# Patient Record
Sex: Male | Born: 1980 | Race: White | Hispanic: No
Health system: Southern US, Community
[De-identification: ages and names within clinical notes are randomized; demographics above are authoritative.]

## PROBLEM LIST (undated history)

## (undated) DIAGNOSIS — G35D Multiple sclerosis, unspecified: Secondary | ICD-10-CM

---

## 2020-08-12 ENCOUNTER — Emergency Department (HOSPITAL_COMMUNITY)
Admission: EM | Admit: 2020-08-12 | Discharge: 2020-08-13 | Disposition: A | Discharge status: Home / Self Care | Payer: BC Managed Care – PPO | Attending: Emergency Medicine | Admitting: Emergency Medicine

## 2020-08-12 ENCOUNTER — Emergency Department (HOSPITAL_COMMUNITY): Payer: BC Managed Care – PPO

## 2020-08-12 DIAGNOSIS — R000 Tachycardia, unspecified: Secondary | ICD-10-CM | POA: Insufficient documentation

## 2020-08-12 DIAGNOSIS — U071 COVID-19: Secondary | ICD-10-CM

## 2020-08-12 DIAGNOSIS — R0682 Tachypnea, not elsewhere classified: Secondary | ICD-10-CM | POA: Diagnosis not present

## 2020-08-12 DIAGNOSIS — R059 Cough, unspecified: Secondary | ICD-10-CM | POA: Diagnosis present

## 2020-08-12 DIAGNOSIS — R0602 Shortness of breath: Secondary | ICD-10-CM

## 2020-08-12 LAB — CBC WITH DIFFERENTIAL/PLATELET
Abs Immature Granulocytes: 0.03 10*3/uL (ref 0.00–0.07)
Basophils Absolute: 0 10*3/uL (ref 0.0–0.1)
Basophils Relative: 1 %
Eosinophils Absolute: 0.1 10*3/uL (ref 0.0–0.5)
Eosinophils Relative: 3 %
HCT: 47.3 % (ref 39.0–52.0)
Hemoglobin: 15.5 g/dL (ref 13.0–17.0)
Immature Granulocytes: 1 %
Lymphocytes Relative: 15 %
Lymphs Abs: 0.6 10*3/uL — ABNORMAL LOW (ref 0.7–4.0)
MCH: 28.8 pg (ref 26.0–34.0)
MCHC: 32.8 g/dL (ref 30.0–36.0)
MCV: 87.9 fL (ref 80.0–100.0)
Monocytes Absolute: 0.6 10*3/uL (ref 0.1–1.0)
Monocytes Relative: 13 %
Neutro Abs: 3 10*3/uL (ref 1.7–7.7)
Neutrophils Relative %: 67 %
Platelets: 187 10*3/uL (ref 150–400)
RBC: 5.38 MIL/uL (ref 4.22–5.81)
RDW: 14.4 % (ref 11.5–15.5)
WBC: 4.3 10*3/uL (ref 4.0–10.5)
nRBC: 0 % (ref 0.0–0.2)

## 2020-08-12 LAB — COMPREHENSIVE METABOLIC PANEL
ALT: 41 U/L (ref 0–44)
AST: 23 U/L (ref 15–41)
Albumin: 4.1 g/dL (ref 3.5–5.0)
Alkaline Phosphatase: 60 U/L (ref 38–126)
Anion gap: 11 (ref 5–15)
BUN: 13 mg/dL (ref 6–20)
CO2: 21 mmol/L — ABNORMAL LOW (ref 22–32)
Calcium: 9.2 mg/dL (ref 8.9–10.3)
Chloride: 108 mmol/L (ref 98–111)
Creatinine, Ser: 1.05 mg/dL (ref 0.61–1.24)
GFR, Estimated: >60 mL/min (ref >60)
Glucose, Bld: 111 mg/dL — ABNORMAL HIGH (ref 70–99)
Potassium: 3.6 mmol/L (ref 3.5–5.1)
Sodium: 140 mmol/L (ref 135–145)
Total Bilirubin: 0.6 mg/dL (ref 0.3–1.2)
Total Protein: 7.1 g/dL (ref 6.5–8.1)

## 2020-08-12 LAB — LACTIC ACID, PLASMA: Lactic Acid, Venous: 1.2 mmol/L (ref 0.5–1.9)

## 2020-08-12 LAB — BRAIN NATRIURETIC PEPTIDE: B Natriuretic Peptide: 8.9 pg/mL (ref 0.0–100.0)

## 2020-08-12 LAB — D-DIMER, QUANTITATIVE: D-Dimer, Quant: 1.35 ug/mL-FEU — ABNORMAL HIGH (ref 0.00–0.50)

## 2020-08-12 LAB — TROPONIN I (HIGH SENSITIVITY): Troponin I (High Sensitivity): 2 ng/L (ref <18)

## 2020-08-12 LAB — PROTIME-INR
INR: 1 (ref 0.8–1.2)
Prothrombin Time: 12.8 seconds (ref 11.4–15.2)

## 2020-08-12 MED ORDER — ALBUTEROL SULFATE HFA 108 (90 BASE) MCG/ACT IN AERS
2.0000 | INHALATION_SPRAY | RESPIRATORY_TRACT | Status: DC | PRN
  Administered 2020-08-12: 23:00:00 2 via RESPIRATORY_TRACT
  Filled 2020-08-12: qty 6.7

## 2020-08-12 NOTE — ED Provider Notes (Signed)
[facility] EMERGENCY DEPARTMENT Provider Note   CSN: [id number] Arrival date & time: 08/12/20  2126     History Chief Complaint  Patient presents with  . Shortness of Breath  . Chest Pain    Dakota Andersen is a 39 y.o. male.  HPI Patient has had 2 Pfizer  immunizations for Covid administered around August 2021.  He reports that 6 days ago he started developing symptoms of body ache and cough.  He got tested for Covid the following day.  His test result came back positive yesterday.  Patient reports over the past several days now he has had increasing cough.  He has become increasingly short of breath and today is finding breathing very difficult.  He has not had any vomiting.  There has been moderate amount of diarrhea.  No abdominal pain.  He has not documented a fever.  Has had increased fatigue and body aches.  Patient has history of MS and is treated with          .  Patient is a non-smoker.  No history of asthma.  No known sick contacts.    No past medical history on file.  There are no problems to display for this patient.   Past medical history: Multiple sclerosis     No family history on file.     Home Medications Prior to Admission medications   Not on File    Allergies    Patient has no allergy information on record.  Review of Systems   Review of Systems 10 systems reviewed and negative except as per HPI Physical Exam Updated Vital Signs BP (!) 122/93   Pulse 98   Temp 98.1 F (36.7 C) (Oral)   Resp 17   SpO2 98%   Physical Exam Constitutional:      Comments: Patient is alert.  He is uncomfortable appearance.  Tachypneic.  Mental status clear.  HENT:     Head: Normocephalic and atraumatic.     Mouth/Throat:     Pharynx: Oropharynx is clear.  Eyes:     Extraocular Movements: Extraocular movements intact.  Cardiovascular:     Comments: Borderline tachycardia no gross rub murmur gallop. Pulmonary:     Comments: Tachypnea.   Breath sounds decreased at bases. Abdominal:     General: There is no distension.     Palpations: Abdomen is soft.     Tenderness: There is no abdominal tenderness. There is no guarding.  Musculoskeletal:        General: No swelling or tenderness. Normal range of motion.     Right lower leg: No edema.     Left lower leg: No edema.  Skin:    General: Skin is warm and dry.  Neurological:     General: No focal deficit present.     Mental Status: He is oriented to person, place, and time.     Coordination: Coordination normal.     ED Results / Procedures / Treatments   Labs (all labs ordered are listed, but only abnormal results are displayed) Labs Reviewed  COMPREHENSIVE METABOLIC PANEL  BRAIN NATRIURETIC PEPTIDE  LACTIC ACID, PLASMA  LACTIC ACID, PLASMA  CBC WITH DIFFERENTIAL/PLATELET  D-DIMER, QUANTITATIVE (NOT AT [facility])  PROTIME-INR  TROPONIN I (HIGH SENSITIVITY)    EKG EKG Interpretation  Date/Time:  Monday August 12 2020 21:27:06 EST Ventricular Rate:  112 PR Interval:    QRS Duration: 85 QT Interval:  320 QTC Calculation: 437 R Axis:   -  18 Text Interpretation: Sinus tachycardia Borderline left axis deviation agree, no acute ischemic appearance, no old comparison Confirmed by Arby Barrette [id number]) on 08/12/2020 10:12:11 PM   Radiology DG Chest Port 1 View  Result Date: 08/12/2020 CLINICAL DATA:  COVID-19 positive, short of breath, chest pain EXAM: PORTABLE CHEST 1 VIEW COMPARISON:  07/22/2017 FINDINGS: The heart size and mediastinal contours are within normal limits. Both lungs are clear. The visualized skeletal structures are unremarkable. IMPRESSION: No active disease. Electronically Signed   By: Sharlet Salina M.D.   On: 08/12/2020 22:00    Procedures Procedures (including critical care time)  Medications Ordered in ED Medications  albuterol (VENTOLIN HFA) 108 (90 Base) MCG/ACT inhaler 2 puff (has no administration in time range)    ED Course  I  have reviewed the triage vital signs and the nursing notes.  Pertinent labs & imaging results that were available during my care of the patient were reviewed by me and considered in my medical decision making (see chart for details).    MDM Rules/Calculators/A&P                           Dakota Andersen was evaluated in Emergency Department on 08/12/2020 for the symptoms described in the history of present illness. He was evaluated in the context of the global COVID-19 pandemic, which necessitated consideration that the patient might be at risk for infection with the SARS-CoV-2 virus that causes COVID-19. Institutional protocols and algorithms that pertain to the evaluation of patients at risk for COVID-19 are in a state of rapid change based on information released by regulatory bodies including the CDC and federal and state organizations. These policies and algorithms were followed during the patient's care in the ED.  Patient presents increasingly symptomatic with shortness of breath with diagnosis of Covid.  Will initiate diagnostic evaluation.  Patient has risk factor of MS and obesity for more complicated course.  Patient has had 2 immunizations.  Will obtain D-dimer.  Patient had significant increase in shortness of breath.  May need CTA.  Final Clinical Impression(s) / ED Diagnoses Final diagnoses:  COVID-19    Rx / DC Orders ED Discharge Orders    None       Arby Barrette, MD 08/15/20 1710

## 2020-08-12 NOTE — ED Triage Notes (Signed)
BIB by EMS for shortness of breath and chest discomfort. Pain worse with movement. Chest discomfort started today while at rest. SpO2 on RA with EMS 95%, placed on 2L for comfort. SPO2 currently 98% on room air, respirations even and unlabored. RR of 19. Tested for COVID 12/22, found out positive 12/25. Patient was vaccinated for COVID, Pfizer - both doses, no booster.

## 2020-08-13 ENCOUNTER — Emergency Department (HOSPITAL_COMMUNITY): Payer: BC Managed Care – PPO

## 2020-08-13 LAB — LACTIC ACID, PLASMA: Lactic Acid, Venous: 0.9 mmol/L (ref 0.5–1.9)

## 2020-08-13 LAB — TROPONIN I (HIGH SENSITIVITY): Troponin I (High Sensitivity): 2 ng/L (ref <18)

## 2020-08-13 MED ORDER — IOHEXOL 350 MG/ML SOLN
80.0000 mL | Freq: Once | INTRAVENOUS | Status: AC | PRN
  Administered 2020-08-13: 80 mL via INTRAVENOUS

## 2020-08-13 NOTE — ED Notes (Signed)
Patient back from CT.

## 2020-08-13 NOTE — ED Provider Notes (Signed)
Care assumed from Dr. Donnald Garre, patient with COVID-19 diagnosed, here for shortness of breath.  Chest x-ray was unremarkable.  He was signed out to me pending D-dimer results.  D-dimer has come back elevated whereas other labs were normal including troponin and BNP.  He was sent for CT angiogram of the chest which showed no evidence of pulmonary embolism, and also showed no evidence of groundglass densities or pneumonia.  Patient was advised of these findings.  He was ambulated in the room and to maintain adequate oxygen saturation during ambulation.  He was therefore felt to be safe for discharge.  He does have some risk factors for severe disease, and his information is sent to the monoclonal antibody clinic for consideration for monoclonal antibody infusion as an outpatient.  He is given strict return precautions.  Results for orders placed or performed during the hospital encounter of 08/12/20  Comprehensive metabolic panel  Result Value Ref Range   Sodium 140 135 - 145 mmol/L   Potassium 3.6 3.5 - 5.1 mmol/L   Chloride 108 98 - 111 mmol/L   CO2 21 (L) 22 - 32 mmol/L   Glucose, Bld 111 (H) 70 - 99 mg/dL   BUN 13 6 - 20 mg/dL   Creatinine, Ser 0.62 0.61 - 1.24 mg/dL   Calcium 9.2 8.9 - 69.4 mg/dL   Total Protein 7.1 6.5 - 8.1 g/dL   Albumin 4.1 3.5 - 5.0 g/dL   AST 23 15 - 41 U/L   ALT 41 0 - 44 U/L   Alkaline Phosphatase 60 38 - 126 U/L   Total Bilirubin 0.6 0.3 - 1.2 mg/dL   GFR, Estimated >85 >46 mL/min   Anion gap 11 5 - 15  Brain natriuretic peptide  Result Value Ref Range   B Natriuretic Peptide 8.9 0.0 - 100.0 pg/mL  Lactic acid, plasma  Result Value Ref Range   Lactic Acid, Venous 1.2 0.5 - 1.9 mmol/L  Lactic acid, plasma  Result Value Ref Range   Lactic Acid, Venous 0.9 0.5 - 1.9 mmol/L  CBC with Differential  Result Value Ref Range   WBC 4.3 4.0 - 10.5 K/uL   RBC 5.38 4.22 - 5.81 MIL/uL   Hemoglobin 15.5 13.0 - 17.0 g/dL   HCT 27.0 35.0 - 09.3 %   MCV 87.9 80.0 -  100.0 fL   MCH 28.8 26.0 - 34.0 pg   MCHC 32.8 30.0 - 36.0 g/dL   RDW 81.8 29.9 - 37.1 %   Platelets 187 150 - 400 K/uL   nRBC 0.0 0.0 - 0.2 %   Neutrophils Relative % 67 %   Neutro Abs 3.0 1.7 - 7.7 K/uL   Lymphocytes Relative 15 %   Lymphs Abs 0.6 (L) 0.7 - 4.0 K/uL   Monocytes Relative 13 %   Monocytes Absolute 0.6 0.1 - 1.0 K/uL   Eosinophils Relative 3 %   Eosinophils Absolute 0.1 0.0 - 0.5 K/uL   Basophils Relative 1 %   Basophils Absolute 0.0 0.0 - 0.1 K/uL   Immature Granulocytes 1 %   Abs Immature Granulocytes 0.03 0.00 - 0.07 K/uL  D-dimer, quantitative  Result Value Ref Range   D-Dimer, Quant 1.35 (H) 0.00 - 0.50 ug/mL-FEU  Protime-INR  Result Value Ref Range   Prothrombin Time 12.8 11.4 - 15.2 seconds   INR 1.0 0.8 - 1.2  Troponin I (High Sensitivity)  Result Value Ref Range   Troponin I (High Sensitivity) 2 <18 ng/L  Troponin I (High Sensitivity)  Result Value Ref Range   Troponin I (High Sensitivity) 2 <18 ng/L   CT Angio Chest PE W and/or Wo Contrast  Result Date: 08/13/2020 CLINICAL DATA:  Dyspnea, chest pain, elevated D-dimer EXAM: CT ANGIOGRAPHY CHEST WITH CONTRAST TECHNIQUE: Multidetector CT imaging of the chest was performed using the standard protocol during bolus administration of intravenous contrast. Multiplanar CT image reconstructions and MIPs were obtained to evaluate the vascular anatomy. CONTRAST:  70mL OMNIPAQUE IOHEXOL 350 MG/ML SOLN COMPARISON:  None. FINDINGS: Cardiovascular: The pulmonary arterial tree is well opacified. There is no intraluminal filling defect identified to suggest acute pulmonary embolism. The central pulmonary arteries are of normal caliber. No significant coronary artery calcification. Global cardiac size within normal limits. No pericardial effusion. The thoracic aorta is unremarkable. Mediastinum/Nodes: No enlarged mediastinal, hilar, or axillary lymph nodes. Thyroid gland, trachea, and esophagus demonstrate no significant  findings. Lungs/Pleura: Lungs are clear. No pleural effusion or pneumothorax. Upper Abdomen: No acute abnormality. Musculoskeletal: The osseous structures are age-appropriate. No acute bone abnormality identified within the thorax. Review of the MIP images confirms the above findings. IMPRESSION: No pulmonary embolism. No radiographic evidence of acute cardiopulmonary disease. Electronically Signed   By: Helyn Numbers MD   On: 08/13/2020 02:17   DG Chest Port 1 View  Result Date: 08/12/2020 CLINICAL DATA:  COVID-19 positive, short of breath, chest pain EXAM: PORTABLE CHEST 1 VIEW COMPARISON:  07/22/2017 FINDINGS: The heart size and mediastinal contours are within normal limits. Both lungs are clear. The visualized skeletal structures are unremarkable. IMPRESSION: No active disease. Electronically Signed   By: Sharlet Salina M.D.   On: 08/12/2020 22:00   Images viewed by me.    Dione Booze, MD 08/13/20 [phone]

## 2020-08-13 NOTE — ED Notes (Signed)
Pulse ox checked while ambulating in room on room air. Patient tolerated well with no increased respiratory effort. SpO2 remained 97-98%. Dr. Preston Fleeting made aware of same.

## 2020-08-13 NOTE — ED Notes (Signed)
Patient to CT.

## 2020-08-13 NOTE — Discharge Instructions (Addendum)
In spite of the way you feel, your oxygen levels were good - even when you were walking.  I have sent your information to the clinic that administers monoclonal antibodies to treat COVID-19. They will contact you if you qualify for the infusion.  Return if your symptoms are getting worse.

## 2022-02-17 IMAGING — DX DG CHEST 1V PORT
1 series · 1 of 1 positions shown · non-contrast
Comparison: 07/22/2017

CLINICAL DATA: [id number] positive, short of breath, chest pain

EXAM:
PORTABLE CHEST 1 VIEW

[chest ap]
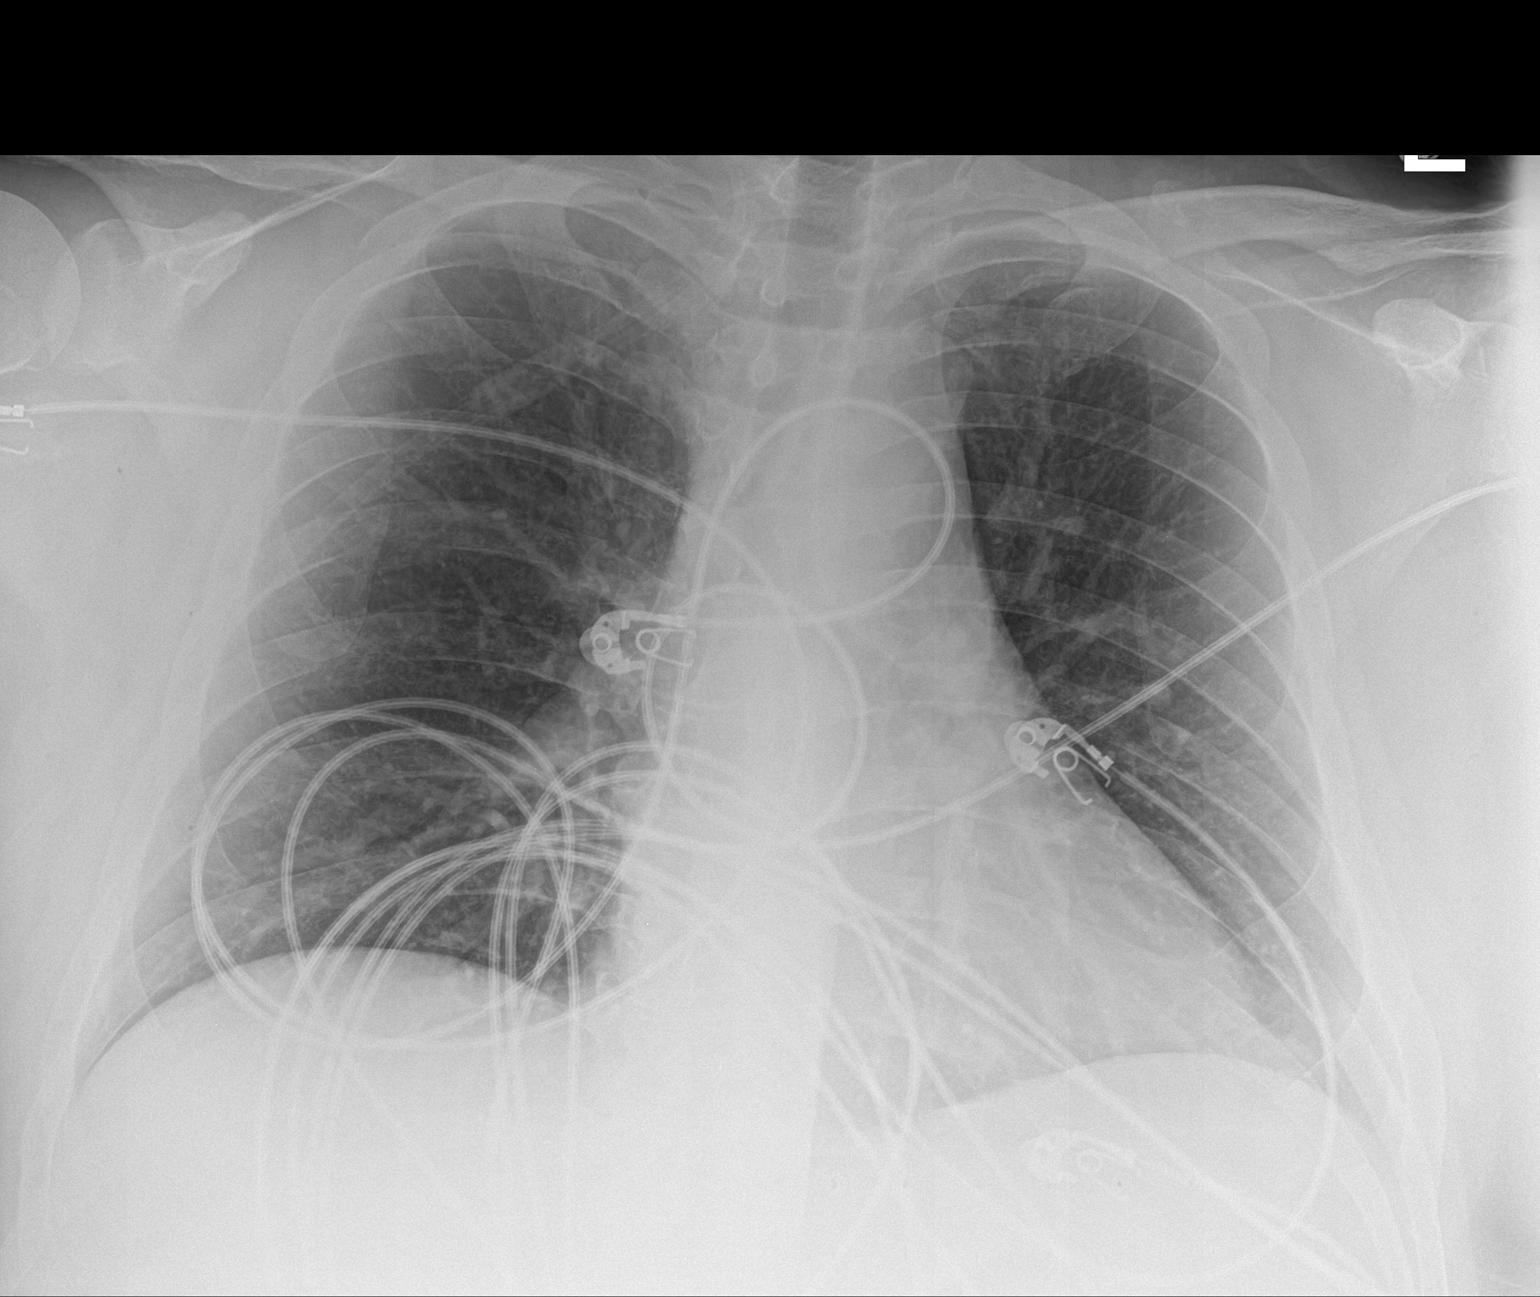

[1 of 1 positions shown; findings below may reference images not displayed]

FINDINGS: The heart size and mediastinal contours are within normal limits.
Both lungs are clear. The visualized skeletal structures are
unremarkable.
IMPRESSION: No active disease.

## 2024-06-30 ENCOUNTER — Encounter (HOSPITAL_BASED_OUTPATIENT_CLINIC_OR_DEPARTMENT_OTHER): Payer: Self-pay

## 2024-06-30 ENCOUNTER — Ambulatory Visit (HOSPITAL_BASED_OUTPATIENT_CLINIC_OR_DEPARTMENT_OTHER)
Admission: RE | Admit: 2024-06-30 | Discharge: 2024-06-30 | Disposition: A | Discharge status: Home / Self Care | Source: Ambulatory Visit | Attending: Family Medicine | Admitting: Family Medicine

## 2024-06-30 ENCOUNTER — Ambulatory Visit (HOSPITAL_BASED_OUTPATIENT_CLINIC_OR_DEPARTMENT_OTHER): Admit: 2024-06-30 | Discharge: 2024-06-30 | Disposition: A | Discharge status: Home / Self Care | Admitting: Radiology

## 2024-06-30 VITALS — BP 105/74 | HR 80 | Temp 98.3°F | Resp 20

## 2024-06-30 DIAGNOSIS — R1032 Left lower quadrant pain: Secondary | ICD-10-CM

## 2024-06-30 DIAGNOSIS — M549 Dorsalgia, unspecified: Secondary | ICD-10-CM | POA: Diagnosis not present

## 2024-06-30 DIAGNOSIS — R10A2 Flank pain, left side: Secondary | ICD-10-CM

## 2024-06-30 DIAGNOSIS — R1012 Left upper quadrant pain: Secondary | ICD-10-CM

## 2024-06-30 DIAGNOSIS — Z87442 Personal history of urinary calculi: Secondary | ICD-10-CM

## 2024-06-30 HISTORY — DX: Multiple sclerosis, unspecified: G35.D

## 2024-06-30 LAB — POCT URINE DIPSTICK
Bilirubin, UA: NEGATIVE
Blood, UA: NEGATIVE
Glucose, UA: NEGATIVE mg/dL
Leukocytes, UA: NEGATIVE
Nitrite, UA: NEGATIVE
Protein Ur, POC: 30 mg/dL — AB
Spec Grav, UA: >=1.03 — AB (ref 1.010–1.025)
Urobilinogen, UA: 0.2 U/dL
pH, UA: 5.5 (ref 5.0–8.0)

## 2024-06-30 MED ORDER — KETOROLAC TROMETHAMINE 30 MG/ML IJ SOLN
30.0000 mg | Freq: Once | INTRAMUSCULAR | Status: AC
  Administered 2024-06-30: 30 mg via INTRAMUSCULAR

## 2024-06-30 MED ORDER — KETOROLAC TROMETHAMINE 10 MG PO TABS: 10.0000 mg | ORAL_TABLET | Freq: Four times a day (QID) | ORAL | 0 refills | Status: AC | PRN

## 2024-06-30 NOTE — Discharge Instructions (Addendum)
 Mid back pain on the left, left flank pain, history of kidney stones, left upper and lower abdominal pain: Urinalysis shows concentrated urine but no blood and no sign of infection.  Abdominal x-ray was negative for kidney stones.    Ketorolac 30 mg injection for pain now.  Ketorolac 10 mg, with food, every 6 hours if needed for back or flank pain.  Advised to continue to try to get 3 to 4 L of fluids daily.  If pain persist he needs to go to an emergency room where he can get a CT scan to rule out stones that are not big enough to be visible on abdominal x-ray (KUB).  Follow-up with primary care if symptoms persist, do not resolve, or worsen.  May need referral to urology.

## 2024-06-30 NOTE — ED Triage Notes (Signed)
 Pt c/o back pain that started yesterday, he thinks it might be a kidney stone, back is lower left side.

## 2024-06-30 NOTE — ED Provider Notes (Addendum)
 [facility]    CSN: [id number] Arrival date & time: 06/30/24  1742      History   Chief Complaint Chief Complaint  Patient presents with   Back Pain    Entered by patient    HPI Dakota Andersen is a 43 y.o. male.   43 year old male with complaint of acute onset left flank or back pain that started on 06/29/2024.  He has had kidney stones in the past and believes he is trying to pass a kidney stone.  The pain got worse today and he has become nauseous but has not vomited.  He has left upper quadrant and left lower quadrant abdominal pain.  He denies fever, frequency of urination, urgency of urination nor hematuria.   Back Pain Associated symptoms: abdominal pain   Associated symptoms: no chest pain, no dysuria and no fever     Past Medical History:  Diagnosis Date   MS (multiple sclerosis)     There are no active problems to display for this patient.   History reviewed. No pertinent surgical history.     Home Medications    Prior to Admission medications   Medication Sig Start Date End Date Taking? Authorizing Provider  ascorbic acid (VITAMIN C) 500 MG tablet Take 500 mg by mouth daily.   Yes [provider]  Cholecalciferol (VITAMIN D3 PO) Take 1 capsule by mouth daily.   Yes [provider]  Diroximel Fumarate (VUMERITY) 231 MG CPDR Take 462 mg by mouth in the morning and at bedtime.   Yes [provider]  ketorolac (TORADOL) 10 MG tablet Take 1 tablet (10 mg total) by mouth every 6 (six) hours as needed. 06/30/24  Yes Ival Domino, FNP  topiramate (TOPAMAX) 50 MG tablet Take 50 mg by mouth in the morning and at bedtime.   Yes [provider]  albuterol  (PROVENTIL ) (2.5 MG/3ML) 0.083% nebulizer solution Take 2.5 mg by nebulization every 6 (six) hours as needed for wheezing or shortness of breath.    [provider]  COD LIVER OIL PO Take 1 capsule by mouth daily.    [provider]  Cyanocobalamin  (VITAMIN B12 PO) Take 1 tablet by mouth daily.    [provider]  Omega-3 Fatty Acids (OMEGA 3 PO) Take 1 capsule by mouth daily.    [provider]    Family History History reviewed. No pertinent family history.  Social History Social History   Tobacco Use   Smoking status: Never   Smokeless tobacco: Never  Vaping Use   Vaping status: Never Used  Substance Use Topics   Alcohol use: Never   Drug use: Never     Allergies   Gadobenate and Latex   Review of Systems Review of Systems  Constitutional:  Negative for chills and fever.  HENT:  Negative for ear pain and sore throat.   Eyes:  Negative for pain and visual disturbance.  Respiratory:  Negative for cough.   Cardiovascular:  Negative for chest pain and palpitations.  Gastrointestinal:  Positive for abdominal pain. Negative for constipation, diarrhea, nausea and vomiting.  Genitourinary:  Negative for dysuria and hematuria.  Musculoskeletal:  Positive for back pain. Negative for arthralgias.  Skin:  Negative for color change and rash.  Neurological:  Negative for seizures and syncope.  All other systems reviewed and are negative.    Physical Exam Triage Vital Signs ED Triage Vitals  Encounter Vitals Group     BP 06/30/24 1827 113/81  Girls Systolic BP Percentile --      Girls Diastolic BP Percentile --      Boys Systolic BP Percentile --      Boys Diastolic BP Percentile --      Pulse Rate 06/30/24 1827 (!) 106     Resp 06/30/24 1827 20     Temp 06/30/24 1827 98.3 F (36.8 C)     Temp Source 06/30/24 1827 Oral     SpO2 06/30/24 1827 96 %     Weight --      Height --      Head Circumference --      Peak Flow --      Pain Score 06/30/24 1825 7     Pain Loc --      Pain Education --      Exclude from Growth Chart --    No data found.  Updated Vital Signs BP 105/74 (BP Location: Right Arm)   Pulse 80   Temp 98.3 F (36.8 C) (Oral)   Resp 20   SpO2 96%   Visual  Acuity Right Eye Distance:   Left Eye Distance:   Bilateral Distance:    Right Eye Near:   Left Eye Near:    Bilateral Near:     Physical Exam Vitals and nursing note reviewed.  Constitutional:      General: He is not in acute distress.    Appearance: He is well-developed. He is not ill-appearing, toxic-appearing or diaphoretic.  HENT:     Head: Normocephalic and atraumatic.     Right Ear: Hearing, tympanic membrane, ear canal and external ear normal.     Left Ear: Hearing, tympanic membrane, ear canal and external ear normal.     Nose: No congestion or rhinorrhea.     Right Sinus: No maxillary sinus tenderness or frontal sinus tenderness.     Left Sinus: No maxillary sinus tenderness or frontal sinus tenderness.     Mouth/Throat:     Lips: Pink.     Mouth: Mucous membranes are moist.     Pharynx: Uvula midline. No oropharyngeal exudate or posterior oropharyngeal erythema.     Tonsils: No tonsillar exudate.  Eyes:     Conjunctiva/sclera: Conjunctivae normal.     Pupils: Pupils are equal, round, and reactive to light.  Cardiovascular:     Rate and Rhythm: Normal rate and regular rhythm.     Heart sounds: S1 normal and S2 normal. No murmur heard. Pulmonary:     Effort: Pulmonary effort is normal. No respiratory distress.     Breath sounds: Normal breath sounds. No decreased breath sounds, wheezing, rhonchi or rales.  Abdominal:     General: Bowel sounds are normal.     Palpations: Abdomen is soft.     Tenderness: There is abdominal tenderness (CVA tenderness is moderate to severe.  Abdominal tenderness is moderate.) in the left upper quadrant and left lower quadrant. There is left CVA tenderness. There is no right CVA tenderness, guarding or rebound. Negative signs include Murphy's sign, Rovsing's sign and McBurney's sign.  Musculoskeletal:        General: No swelling.     Cervical back: Neck supple.  Lymphadenopathy:     Head:     Right side of head: No submental,  submandibular, tonsillar, preauricular or posterior auricular adenopathy.     Left side of head: No submental, submandibular, tonsillar, preauricular or posterior auricular adenopathy.     Cervical: No cervical adenopathy.  Right cervical: No superficial cervical adenopathy.    Left cervical: No superficial cervical adenopathy.  Skin:    General: Skin is warm and dry.     Capillary Refill: Capillary refill takes less than 2 seconds.     Findings: No rash.  Neurological:     Mental Status: He is alert and oriented to person, place, and time.  Psychiatric:        Mood and Affect: Mood normal.    Patient had some relief of his back and flank pain with use of the ketorolac injection.  But he had a vagal reaction and got very pale, zoned out a little bit, and his blood pressure dropped some.  We laid him out on the bed with his head propped up a little, put a cool compress on his head and rechecked him after 5 minutes.  He never lost consciousness and he did not vomit.  Recheck blood pressure was significantly improved and he felt fine walking.  He was not dizzy or lightheaded.  UC Treatments / Results  Labs (all labs ordered are listed, but only abnormal results are displayed) Labs Reviewed  POCT URINE DIPSTICK - Abnormal; Notable for the following components:      Result Value   Ketones, POC UA trace (5) (*)    Spec Grav, UA >=1.030 (*)    Protein Ur, POC =30 (*)    All other components within normal limits    EKG   Radiology DG Abd 1 View Result Date: 06/30/2024 CLINICAL DATA:  Left lower quadrant pain EXAM: ABDOMEN - 1 VIEW COMPARISON:  07/22/2017 CT FINDINGS: The bowel gas pattern is normal. No radio-opaque calculi or other significant radiographic abnormality are seen. IMPRESSION: Negative. Electronically Signed   By: Luke Bun M.D.   On: 06/30/2024 19:37    Procedures Procedures (including critical care time)  Medications Ordered in UC Medications  ketorolac  (TORADOL) 30 MG/ML injection 30 mg (30 mg Intramuscular Given 06/30/24 1954)    Initial Impression / Assessment and Plan / UC Course  I have reviewed the triage vital signs and the nursing notes.  Pertinent labs & imaging results that were available during my care of the patient were reviewed by me and considered in my medical decision making (see chart for details).  Plan of Care: Mid back pain on the left, left flank pain, history of kidney stones, left upper and lower abdominal pain: Urinalysis shows concentrated urine but no blood and no sign of infection.  Abdominal x-ray was negative for kidney stones.    Ketorolac 30 mg injection for pain now.  Ketorolac 10 mg, with food, every 6 hours if needed for back or flank pain.  Advised to continue to try to get 3 to 4 L of fluids daily.  If pain persist he needs to go to an emergency room where he can get a CT scan to rule out stones that are not big enough to be visible on abdominal x-ray (KUB).  Follow-up with primary care if symptoms persist, do not resolve, or worsen.  May need referral to urology.  I reviewed the plan of care with the patient and/or the patient's guardian.  The patient and/or guardian had time to ask questions and acknowledged that the questions were answered.  I provided instruction on symptoms or reasons to return here or to go to an ER, if symptoms/condition did not improve, worsened or if new symptoms occurred.  Final Clinical Impressions(s) / UC Diagnoses  Final diagnoses:  Abdominal pain, left lower quadrant  Left upper quadrant abdominal pain  Left flank pain  History of kidney stones  Mid back pain on left side     Discharge Instructions      Mid back pain on the left, left flank pain, history of kidney stones, left upper and lower abdominal pain: Urinalysis shows concentrated urine but no blood and no sign of infection.  Abdominal x-ray was negative for kidney stones.    Ketorolac 30 mg injection for  pain now.  Ketorolac 10 mg, with food, every 6 hours if needed for back or flank pain.  Advised to continue to try to get 3 to 4 L of fluids daily.  If pain persist he needs to go to an emergency room where he can get a CT scan to rule out stones that are not big enough to be visible on abdominal x-ray (KUB).  Follow-up with primary care if symptoms persist, do not resolve, or worsen.  May need referral to urology.     ED Prescriptions     Medication Sig Dispense Auth. Provider   ketorolac (TORADOL) 10 MG tablet Take 1 tablet (10 mg total) by mouth every 6 (six) hours as needed. 20 tablet Adysen Raphael, FNP      PDMP not reviewed this encounter.   Ival Domino, FNP 06/30/24 1958    Ival Domino, FNP 06/30/24 2018
